# Patient Record
Sex: Female | Born: 1965 | Race: Black or African American | Hispanic: No | Marital: Married | Smoking: Never smoker
Health system: Southern US, Community
[De-identification: ages and names within clinical notes are randomized; demographics above are authoritative.]

## PROBLEM LIST (undated history)

## (undated) DIAGNOSIS — I493 Ventricular premature depolarization: Secondary | ICD-10-CM

## (undated) DIAGNOSIS — E039 Hypothyroidism, unspecified: Secondary | ICD-10-CM

## (undated) DIAGNOSIS — D649 Anemia, unspecified: Secondary | ICD-10-CM

## (undated) DIAGNOSIS — G43909 Migraine, unspecified, not intractable, without status migrainosus: Secondary | ICD-10-CM

## (undated) DIAGNOSIS — I491 Atrial premature depolarization: Secondary | ICD-10-CM

## (undated) HISTORY — DX: Ventricular premature depolarization: I49.3

## (undated) HISTORY — DX: Hypothyroidism, unspecified: E03.9

## (undated) HISTORY — DX: Atrial premature depolarization: I49.1

## (undated) HISTORY — DX: Migraine, unspecified, not intractable, without status migrainosus: G43.909

## (undated) HISTORY — DX: Anemia, unspecified: D64.9

---

## 1993-05-20 HISTORY — PX: TUBAL LIGATION: SHX77

## 1998-02-17 ENCOUNTER — Encounter: Payer: Self-pay | Admitting: Internal Medicine

## 1998-02-17 ENCOUNTER — Encounter: Admission: RE | Admit: 1998-02-17 | Discharge: 1998-05-18 | Payer: Self-pay | Admitting: Internal Medicine

## 1999-03-05 ENCOUNTER — Encounter: Admission: RE | Admit: 1999-03-05 | Discharge: 1999-03-05 | Payer: Self-pay | Admitting: Family Medicine

## 1999-04-30 ENCOUNTER — Ambulatory Visit (HOSPITAL_COMMUNITY): Admission: RE | Admit: 1999-04-30 | Discharge: 1999-04-30 | Payer: Self-pay | Admitting: Internal Medicine

## 1999-05-03 ENCOUNTER — Encounter: Payer: Self-pay | Admitting: Internal Medicine

## 1999-05-03 ENCOUNTER — Ambulatory Visit (HOSPITAL_COMMUNITY): Admission: RE | Admit: 1999-05-03 | Discharge: 1999-05-03 | Payer: Self-pay | Admitting: Internal Medicine

## 1999-07-10 ENCOUNTER — Other Ambulatory Visit: Admission: RE | Admit: 1999-07-10 | Discharge: 1999-07-10 | Payer: Self-pay | Admitting: Family Medicine

## 1999-09-07 ENCOUNTER — Encounter: Admission: RE | Admit: 1999-09-07 | Discharge: 1999-09-07 | Payer: Self-pay | Admitting: Family Medicine

## 1999-09-07 ENCOUNTER — Encounter: Payer: Self-pay | Admitting: Family Medicine

## 2000-07-07 ENCOUNTER — Other Ambulatory Visit: Admission: RE | Admit: 2000-07-07 | Discharge: 2000-07-07 | Payer: Self-pay | Admitting: Family Medicine

## 2002-04-14 ENCOUNTER — Ambulatory Visit (HOSPITAL_COMMUNITY): Admission: RE | Admit: 2002-04-14 | Discharge: 2002-04-14 | Payer: Self-pay | Admitting: Family Medicine

## 2002-04-14 ENCOUNTER — Encounter: Payer: Self-pay | Admitting: Family Medicine

## 2002-10-08 ENCOUNTER — Other Ambulatory Visit: Admission: RE | Admit: 2002-10-08 | Discharge: 2002-10-08 | Payer: Self-pay | Admitting: Family Medicine

## 2002-10-12 ENCOUNTER — Encounter: Admission: RE | Admit: 2002-10-12 | Discharge: 2002-10-12 | Payer: Self-pay | Admitting: Family Medicine

## 2002-10-12 ENCOUNTER — Encounter: Payer: Self-pay | Admitting: Family Medicine

## 2002-11-09 ENCOUNTER — Ambulatory Visit (HOSPITAL_COMMUNITY): Admission: RE | Admit: 2002-11-09 | Discharge: 2002-11-09 | Payer: Self-pay | Admitting: Internal Medicine

## 2004-01-10 ENCOUNTER — Encounter: Admission: RE | Admit: 2004-01-10 | Discharge: 2004-01-10 | Payer: Self-pay | Admitting: Family Medicine

## 2005-01-04 ENCOUNTER — Other Ambulatory Visit: Admission: RE | Admit: 2005-01-04 | Discharge: 2005-01-04 | Payer: Self-pay | Admitting: Family Medicine

## 2005-04-23 ENCOUNTER — Encounter: Admission: RE | Admit: 2005-04-23 | Discharge: 2005-04-23 | Payer: Self-pay | Admitting: Family Medicine

## 2005-07-02 ENCOUNTER — Encounter: Admission: RE | Admit: 2005-07-02 | Discharge: 2005-07-02 | Payer: Self-pay | Admitting: Family Medicine

## 2006-05-14 ENCOUNTER — Encounter: Admission: RE | Admit: 2006-05-14 | Discharge: 2006-05-14 | Payer: Self-pay | Admitting: Family Medicine

## 2007-07-02 ENCOUNTER — Encounter: Admission: RE | Admit: 2007-07-02 | Discharge: 2007-07-02 | Payer: Self-pay | Admitting: Family Medicine

## 2008-01-22 ENCOUNTER — Encounter: Admission: RE | Admit: 2008-01-22 | Discharge: 2008-01-22 | Payer: Self-pay | Admitting: Family Medicine

## 2008-01-28 ENCOUNTER — Encounter: Admission: RE | Admit: 2008-01-28 | Discharge: 2008-01-28 | Payer: Self-pay | Admitting: Family Medicine

## 2009-03-06 ENCOUNTER — Encounter: Admission: RE | Admit: 2009-03-06 | Discharge: 2009-03-06 | Payer: Self-pay | Admitting: Family Medicine

## 2009-06-08 ENCOUNTER — Emergency Department (HOSPITAL_COMMUNITY): Admission: EM | Admit: 2009-06-08 | Discharge: 2009-06-08 | Payer: Self-pay | Admitting: Family Medicine

## 2010-01-25 ENCOUNTER — Encounter: Admission: RE | Admit: 2010-01-25 | Discharge: 2010-01-25 | Payer: Self-pay | Admitting: Family Medicine

## 2010-01-29 ENCOUNTER — Ambulatory Visit: Payer: Self-pay | Admitting: Cardiology

## 2010-02-01 ENCOUNTER — Ambulatory Visit: Payer: Self-pay | Admitting: Cardiology

## 2010-02-01 ENCOUNTER — Ambulatory Visit (HOSPITAL_COMMUNITY): Admission: RE | Admit: 2010-02-01 | Discharge: 2010-02-01 | Payer: Self-pay | Admitting: Cardiology

## 2010-08-06 LAB — POCT I-STAT, CHEM 8
Calcium, Ion: 1.21 mmol/L (ref 1.12–1.32)
Potassium: 4 mEq/L (ref 3.5–5.1)
Sodium: 141 mEq/L (ref 135–145)
TCO2: 27 mmol/L (ref 0–100)

## 2010-08-06 LAB — TSH: TSH: 0.065 u[IU]/mL — ABNORMAL LOW (ref 0.350–4.500)

## 2010-10-05 NOTE — Op Note (Signed)
NAME:  GALE, KLAR NO.:  [id number]   MEDICAL RECORD NO.:  [id number]                   PATIENT TYPE:  AMB   LOCATION:  ENDO                                 FACILITY:  [facility]   PHYSICIAN:  Lina Sar, M.D. [facility]               DATE OF BIRTH:  Aug 17, 1965   DATE OF PROCEDURE:  11/09/2002  DATE OF DISCHARGE:                                 OPERATIVE REPORT   NAME OF PROCEDURE:  Colonoscopy.   INDICATIONS FOR PROCEDURE:  This 45 year old African-American female has a  history of chronic constipation as well as symptoms of epigastric pain.  She  has tried over-the-counter medications and has tried Zelnorm 6 mg b.i.d.,  but because of expense of the medication, she has not been able to take it.  She was put on MiraLax 17 g a day on Oct 11, 2002, with some improvement of  her symptoms.  She has responded very well to her colonoscopy prep, and is  scheduled for screening colonoscopy today to evaluate for her constipation,  to rule out structure changes of her colon.   ENDOSCOPE:  Olympus single chamber video endoscope.   SEDATION:  1. Versed 5 mg IV.  2. Demerol 100 mg IV.   FINDINGS:  The Olympus single channel videoendoscope was passed in the  rectum to the sigmoid colon.  The patient was monitored by pulse oximeter  and oxygen saturations were normal.  Her prep was excellent.  There was no  residual stool in the entire colon.  Anal canal showed small internal  hemorrhoids.  These did not appear to be bleeding.  Sigmoid colon was  normal.  The entire colonic mucosa appeared unremarkable, although there was  some difficulty in negotiating some of the turns through the colon.  There  was no evidence of redundancy.  There were no diverticula.  Colonoscope  passed through the stomach, transverse, into hepatic flexure and ascending  colon.  Cecal pouch was re-checked, the patient was turned in the left  lateral decubitus position.  Video photographs of  the cecal pouch were  taken.  There was normal ileocecal vale and appendiceal opening.  Colonoscope was then slowly retracted from the right to the left colon to  observe the mucosa which appeared to be normal.  There were no polyps.  The  patient tolerated the procedure well.   IMPRESSION:  Normal colonoscopy to the cecum.    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  She is to continue her MiraLax 17 g daily.  I would also  advise that she continue her Zelnorm 6 mg daily to improve the bowel habits.  Lina Sar, M.D. [facility]    DB/MEDQ  D:  11/09/2002  T:  11/09/2002  Job:  [id number]   cc:   Talmadge Coventry, M.D.  [address]  [place]  [place] [address]  Fax: [phone]

## 2010-11-09 ENCOUNTER — Other Ambulatory Visit: Payer: Self-pay | Admitting: Family Medicine

## 2010-11-09 DIAGNOSIS — Z1231 Encounter for screening mammogram for malignant neoplasm of breast: Secondary | ICD-10-CM

## 2010-11-29 ENCOUNTER — Ambulatory Visit
Admission: RE | Admit: 2010-11-29 | Discharge: 2010-11-29 | Disposition: A | Discharge status: Home / Self Care | Payer: 59 | Source: Ambulatory Visit | Attending: Family Medicine | Admitting: Family Medicine

## 2010-11-29 DIAGNOSIS — Z1231 Encounter for screening mammogram for malignant neoplasm of breast: Secondary | ICD-10-CM

## 2011-09-27 ENCOUNTER — Encounter: Payer: Self-pay | Admitting: *Deleted

## 2012-01-29 ENCOUNTER — Other Ambulatory Visit: Payer: Self-pay | Admitting: Family Medicine

## 2012-01-29 DIAGNOSIS — Z1231 Encounter for screening mammogram for malignant neoplasm of breast: Secondary | ICD-10-CM

## 2012-02-05 ENCOUNTER — Encounter: Payer: Self-pay | Admitting: Cardiology

## 2012-02-06 ENCOUNTER — Ambulatory Visit
Admission: RE | Admit: 2012-02-06 | Discharge: 2012-02-06 | Disposition: A | Discharge status: Home / Self Care | Payer: 59 | Source: Ambulatory Visit | Attending: Family Medicine | Admitting: Family Medicine

## 2012-02-06 DIAGNOSIS — Z1231 Encounter for screening mammogram for malignant neoplasm of breast: Secondary | ICD-10-CM

## 2012-02-07 ENCOUNTER — Other Ambulatory Visit: Payer: Self-pay | Admitting: Family Medicine

## 2012-02-07 DIAGNOSIS — R928 Other abnormal and inconclusive findings on diagnostic imaging of breast: Secondary | ICD-10-CM

## 2012-02-12 ENCOUNTER — Ambulatory Visit
Admission: RE | Admit: 2012-02-12 | Discharge: 2012-02-12 | Disposition: A | Discharge status: Home / Self Care | Payer: 59 | Source: Ambulatory Visit | Attending: Family Medicine | Admitting: Family Medicine

## 2012-02-12 DIAGNOSIS — R928 Other abnormal and inconclusive findings on diagnostic imaging of breast: Secondary | ICD-10-CM

## 2013-03-04 ENCOUNTER — Other Ambulatory Visit: Payer: Self-pay

## 2013-03-04 DIAGNOSIS — Z1231 Encounter for screening mammogram for malignant neoplasm of breast: Secondary | ICD-10-CM

## 2013-03-29 ENCOUNTER — Ambulatory Visit
Admission: RE | Admit: 2013-03-29 | Discharge: 2013-03-29 | Disposition: A | Discharge status: Home / Self Care | Payer: 59 | Source: Ambulatory Visit

## 2013-03-29 DIAGNOSIS — Z1231 Encounter for screening mammogram for malignant neoplasm of breast: Secondary | ICD-10-CM

## 2014-04-04 ENCOUNTER — Other Ambulatory Visit: Payer: Self-pay

## 2014-04-04 DIAGNOSIS — Z1231 Encounter for screening mammogram for malignant neoplasm of breast: Secondary | ICD-10-CM

## 2014-04-19 ENCOUNTER — Ambulatory Visit
Admission: RE | Admit: 2014-04-19 | Discharge: 2014-04-19 | Disposition: A | Discharge status: Home / Self Care | Payer: 59 | Source: Ambulatory Visit

## 2014-04-19 DIAGNOSIS — Z1231 Encounter for screening mammogram for malignant neoplasm of breast: Secondary | ICD-10-CM

## 2014-05-06 ENCOUNTER — Other Ambulatory Visit (HOSPITAL_COMMUNITY)
Admission: RE | Admit: 2014-05-06 | Discharge: 2014-05-06 | Disposition: A | Discharge status: Home / Self Care | Payer: 59 | Source: Ambulatory Visit | Attending: Family Medicine | Admitting: Family Medicine

## 2014-05-06 ENCOUNTER — Other Ambulatory Visit: Payer: Self-pay | Admitting: Family Medicine

## 2014-05-06 DIAGNOSIS — Z01419 Encounter for gynecological examination (general) (routine) without abnormal findings: Secondary | ICD-10-CM | POA: Insufficient documentation

## 2014-05-10 ENCOUNTER — Encounter: Payer: Self-pay | Admitting: Internal Medicine

## 2014-05-11 LAB — CYTOLOGY - PAP

## 2014-06-22 ENCOUNTER — Encounter: Payer: Self-pay | Admitting: Internal Medicine

## 2014-07-15 ENCOUNTER — Ambulatory Visit (AMBULATORY_SURGERY_CENTER): Payer: Self-pay | Admitting: *Deleted

## 2014-07-15 VITALS — Ht 66.0 in | Wt 159.8 lb

## 2014-07-15 DIAGNOSIS — Z1211 Encounter for screening for malignant neoplasm of colon: Secondary | ICD-10-CM

## 2014-07-15 MED ORDER — MOVIPREP 100 G PO SOLR: ORAL | Status: DC

## 2014-07-15 NOTE — Progress Notes (Signed)
No allergies to eggs or soy. No problems with anesthesia.  Pt given Emmi instructions for colonoscopy  No oxygen use  No diet drug use  

## 2014-07-29 ENCOUNTER — Encounter: Payer: Self-pay | Admitting: Internal Medicine

## 2014-07-29 ENCOUNTER — Ambulatory Visit (AMBULATORY_SURGERY_CENTER): Payer: 59 | Admitting: Internal Medicine

## 2014-07-29 VITALS — BP 111/79 | HR 65 | Temp 96.9°F | Resp 23 | Ht 66.0 in | Wt 159.0 lb

## 2014-07-29 DIAGNOSIS — Z1211 Encounter for screening for malignant neoplasm of colon: Secondary | ICD-10-CM

## 2014-07-29 MED ORDER — SODIUM CHLORIDE 0.9 % IV SOLN: 500.0000 mL | INTRAVENOUS | Status: DC

## 2014-07-29 NOTE — Patient Instructions (Signed)
YOU HAD AN ENDOSCOPIC PROCEDURE TODAY AT THE [facility]:   Refer to the procedure report that was given to you for any specific questions about what was found during the examination.  If the procedure report does not answer your questions, please call your gastroenterologist to clarify.  If you requested that your care partner not be given the details of your procedure findings, then the procedure report has been included in a sealed envelope for you to review at your convenience later.  YOU SHOULD EXPECT: Some feelings of bloating in the abdomen. Passage of more gas than usual.  Walking can help get rid of the air that was put into your GI tract during the procedure and reduce the bloating. If you had a lower endoscopy (such as a colonoscopy or flexible sigmoidoscopy) you may notice spotting of blood in your stool or on the toilet paper. If you underwent a bowel prep for your procedure, you may not have a normal bowel movement for a few days.  Please Note:  You might notice some irritation and congestion in your nose or some drainage.  This is from the oxygen used during your procedure.  There is no need for concern and it should clear up in a day or so.  SYMPTOMS TO REPORT IMMEDIATELY:   Following lower endoscopy (colonoscopy or flexible sigmoidoscopy):  Excessive amounts of blood in the stool  Significant tenderness or worsening of abdominal pains  Swelling of the abdomen that is new, acute  Fever of 100F or higher   For urgent or emergent issues, a gastroenterologist can be reached at any hour by calling [phone].   DIET: Your first meal following the procedure should be a small meal and then it is ok to progress to your normal diet. Heavy or fried foods are harder to digest and may make you feel nauseous or bloated.  Likewise, meals heavy in dairy and vegetables can increase bloating.  Drink plenty of fluids but you should avoid alcoholic beverages for 24  hours.  ACTIVITY:  You should plan to take it easy for the rest of today and you should NOT DRIVE or use heavy machinery until tomorrow (because of the sedation medicines used during the test).    FOLLOW UP: Our staff will call the number listed on your records the next business day following your procedure to check on you and address any questions or concerns that you may have regarding the information given to you following your procedure. If we do not reach you, we will leave a message.  However, if you are feeling well and you are not experiencing any problems, there is no need to return our call.  We will assume that you have returned to your regular daily activities without incident.  If any biopsies were taken you will be contacted by phone or by letter within the next 1-3 weeks.  Please call us at [phone] if you have not heard about the biopsies in 3 weeks.    SIGNATURES/CONFIDENTIALITY: You and/or your care partner have signed paperwork which will be entered into your electronic medical record.  These signatures attest to the fact that that the information above on your After Visit Summary has been reviewed and is understood.  Full responsibility of the confidentiality of this discharge information lies with you and/or your care-partner.  Read the handouts given to you by your recovery room nurse. 

## 2014-07-29 NOTE — Progress Notes (Signed)
May discharge in 20 minutes if stable per Dr. Juanda ChanceBrodie.

## 2014-07-29 NOTE — Op Note (Signed)
[facility] [address] [place], [id number]   COLONOSCOPY PROCEDURE REPORT  PATIENT: Frances Mckenzie, Frances Mckenzie  MR#: [id number] BIRTHDATE: 1966/04/26 , 48  yrs. old GENDER: female ENDOSCOPIST: Hart Carwinora M Brodie, MD REFERRED XB:JYNWGNBY:Vyvyan Wynelle LinkSun, M.D. PROCEDURE DATE:  07/29/2014 PROCEDURE:   Colonoscopy, screening First Screening Colonoscopy - Avg.  risk and is 50 yrs.  old or older - No.  Prior Negative Screening - Now for repeat screening. 10 or more years since last screening  History of Adenoma - Now for follow-up colonoscopy & has been > or = to 3 yrs.  N/A ASA CLASS:   Class I INDICATIONS:Screening for colonic neoplasia, Colorectal Neoplasm Risk Assessment for this procedure is average risk, and Prior colonoscopy in June 2004 was normal. MEDICATIONS: Monitored anesthesia care and Propofol 350 mg IV  DESCRIPTION OF PROCEDURE:   After the risks benefits and alternatives of the procedure were thoroughly explained, informed consent was obtained.  The digital rectal exam revealed no abnormalities of the rectum.   The LB [id number]-H190 [id number]  endoscope was introduced through the anus and advanced to the cecum, which was identified by the ileocecal valve. No adverse events experienced.   The quality of the prep was good.  (MoviPrep was used)  The instrument was then slowly withdrawn as the colon was fully examined.      COLON FINDINGS: A normal appearing cecum, ileocecal valve, and appendiceal orifice were identified.  The ascending, transverse, descending, sigmoid colon, and rectum appeared unremarkable.   The colon was redundant.  Manual abdominal counter-pressure was used to reach the cecum.  The patient was moved on to their right side to reach the cecum.  Retroflexed views revealed no abnormalities. The time to cecum = 17.00 Withdrawal time = 6.03   The scope was withdrawn and the procedure completed. COMPLICATIONS: There were no immediate complications.  ENDOSCOPIC  IMPRESSION: 1.   Normal colonoscopy 2.   The colon was redundant  RECOMMENDATIONS: High fiber diet Recall colonoscopy in 10 years  eSigned:  Hart Carwinora M Brodie, MD 07/29/2014 10:29 AM   cc:

## 2014-07-29 NOTE — Progress Notes (Signed)
To rcovery, report given to Yetta FlockHodges, Charity fundraiserN. VSS

## 2014-07-29 NOTE — Progress Notes (Addendum)
Patient entered the RR sleeping.  When she woke up a little, she started moaning and holding her ab.  I tried to palpate but she shoved my hand out of the way.  She was given levsin at this time.   Didn't seem to help so a rectal tube was inserted.   A lot of air was released with a merky fluid. Patient still complains of pain. Turned from side to side. Will have patient get dressed and walk the hall.  Patient's abd is soft.  Dr. Juanda ChanceBrodie is aware.  Dr. Juanda ChanceBrodie stated that she could go home since she had a redundant colon. Drink warm fluids etc. Patient aware. Patient laughing and joking upon discharge.

## 2014-08-01 ENCOUNTER — Telehealth: Payer: Self-pay | Admitting: *Deleted

## 2014-08-01 NOTE — Telephone Encounter (Signed)
Message left on f/u call °

## 2014-12-21 ENCOUNTER — Other Ambulatory Visit: Payer: Self-pay | Admitting: Family Medicine

## 2014-12-21 DIAGNOSIS — M25562 Pain in left knee: Secondary | ICD-10-CM

## 2014-12-25 ENCOUNTER — Ambulatory Visit
Admission: RE | Admit: 2014-12-25 | Discharge: 2014-12-25 | Disposition: A | Discharge status: Home / Self Care | Payer: 59 | Source: Ambulatory Visit | Attending: Family Medicine | Admitting: Family Medicine

## 2014-12-25 DIAGNOSIS — M25562 Pain in left knee: Secondary | ICD-10-CM

## 2015-08-03 ENCOUNTER — Other Ambulatory Visit: Payer: Self-pay

## 2015-08-03 DIAGNOSIS — Z1231 Encounter for screening mammogram for malignant neoplasm of breast: Secondary | ICD-10-CM

## 2015-08-15 ENCOUNTER — Ambulatory Visit
Admission: RE | Admit: 2015-08-15 | Discharge: 2015-08-15 | Disposition: A | Discharge status: Home / Self Care | Payer: Commercial Managed Care - HMO | Source: Ambulatory Visit

## 2015-08-15 DIAGNOSIS — Z1231 Encounter for screening mammogram for malignant neoplasm of breast: Secondary | ICD-10-CM

## 2015-11-28 ENCOUNTER — Other Ambulatory Visit: Payer: Self-pay | Admitting: Family Medicine

## 2015-11-28 ENCOUNTER — Other Ambulatory Visit (HOSPITAL_COMMUNITY)
Admission: RE | Admit: 2015-11-28 | Discharge: 2015-11-28 | Disposition: A | Discharge status: Home / Self Care | Payer: Commercial Managed Care - HMO | Source: Ambulatory Visit | Attending: Family Medicine | Admitting: Family Medicine

## 2015-11-28 DIAGNOSIS — Z01419 Encounter for gynecological examination (general) (routine) without abnormal findings: Secondary | ICD-10-CM | POA: Diagnosis not present

## 2015-11-29 LAB — CYTOLOGY - PAP

## 2016-10-15 DIAGNOSIS — R21 Rash and other nonspecific skin eruption: Secondary | ICD-10-CM | POA: Diagnosis not present

## 2017-06-24 DIAGNOSIS — E039 Hypothyroidism, unspecified: Secondary | ICD-10-CM | POA: Diagnosis not present

## 2017-06-24 DIAGNOSIS — D509 Iron deficiency anemia, unspecified: Secondary | ICD-10-CM | POA: Diagnosis not present

## 2017-06-24 DIAGNOSIS — E785 Hyperlipidemia, unspecified: Secondary | ICD-10-CM | POA: Diagnosis not present

## 2017-06-24 DIAGNOSIS — E559 Vitamin D deficiency, unspecified: Secondary | ICD-10-CM | POA: Diagnosis not present

## 2017-08-23 LAB — GLUCOSE, POCT (MANUAL RESULT ENTRY): POC GLUCOSE: 79 mg/dL (ref 70–99)

## 2017-12-05 DIAGNOSIS — E785 Hyperlipidemia, unspecified: Secondary | ICD-10-CM | POA: Diagnosis not present

## 2017-12-05 DIAGNOSIS — E559 Vitamin D deficiency, unspecified: Secondary | ICD-10-CM | POA: Diagnosis not present

## 2017-12-05 DIAGNOSIS — Z0000 Encounter for general adult medical examination without abnormal findings: Secondary | ICD-10-CM | POA: Diagnosis not present

## 2017-12-05 DIAGNOSIS — E039 Hypothyroidism, unspecified: Secondary | ICD-10-CM | POA: Diagnosis not present

## 2018-01-28 DIAGNOSIS — E039 Hypothyroidism, unspecified: Secondary | ICD-10-CM | POA: Diagnosis not present

## 2018-02-18 ENCOUNTER — Other Ambulatory Visit: Payer: Self-pay | Admitting: Family Medicine

## 2018-02-18 DIAGNOSIS — Z1231 Encounter for screening mammogram for malignant neoplasm of breast: Secondary | ICD-10-CM

## 2018-03-30 ENCOUNTER — Ambulatory Visit
Admission: RE | Admit: 2018-03-30 | Discharge: 2018-03-30 | Disposition: A | Discharge status: Home / Self Care | Payer: 59 | Source: Ambulatory Visit | Attending: Family Medicine | Admitting: Family Medicine

## 2018-03-30 DIAGNOSIS — Z1231 Encounter for screening mammogram for malignant neoplasm of breast: Secondary | ICD-10-CM

## 2018-10-22 ENCOUNTER — Other Ambulatory Visit: Payer: Self-pay | Admitting: Family Medicine

## 2018-10-22 DIAGNOSIS — E039 Hypothyroidism, unspecified: Secondary | ICD-10-CM

## 2018-10-22 DIAGNOSIS — R221 Localized swelling, mass and lump, neck: Secondary | ICD-10-CM

## 2018-11-02 ENCOUNTER — Other Ambulatory Visit: Payer: 59

## 2018-12-07 ENCOUNTER — Other Ambulatory Visit: Payer: Self-pay | Admitting: Family Medicine

## 2018-12-07 ENCOUNTER — Other Ambulatory Visit (HOSPITAL_COMMUNITY)
Admission: RE | Admit: 2018-12-07 | Discharge: 2018-12-07 | Disposition: A | Discharge status: Home / Self Care | Payer: 59 | Source: Ambulatory Visit | Attending: Family Medicine | Admitting: Family Medicine

## 2018-12-07 DIAGNOSIS — Z0000 Encounter for general adult medical examination without abnormal findings: Secondary | ICD-10-CM | POA: Diagnosis not present

## 2018-12-08 ENCOUNTER — Other Ambulatory Visit: Payer: Self-pay | Admitting: Family Medicine

## 2018-12-08 DIAGNOSIS — N852 Hypertrophy of uterus: Secondary | ICD-10-CM

## 2018-12-08 LAB — CYTOLOGY - PAP: Diagnosis: NEGATIVE

## 2018-12-23 ENCOUNTER — Ambulatory Visit
Admission: RE | Admit: 2018-12-23 | Discharge: 2018-12-23 | Disposition: A | Discharge status: Home / Self Care | Payer: 59 | Source: Ambulatory Visit | Attending: Family Medicine | Admitting: Family Medicine

## 2018-12-23 DIAGNOSIS — N852 Hypertrophy of uterus: Secondary | ICD-10-CM

## 2019-03-30 ENCOUNTER — Other Ambulatory Visit: Payer: Self-pay | Admitting: Family Medicine

## 2019-03-30 DIAGNOSIS — Z1231 Encounter for screening mammogram for malignant neoplasm of breast: Secondary | ICD-10-CM

## 2019-05-25 ENCOUNTER — Other Ambulatory Visit: Payer: Self-pay

## 2019-05-25 ENCOUNTER — Ambulatory Visit
Admission: RE | Admit: 2019-05-25 | Discharge: 2019-05-25 | Disposition: A | Discharge status: Home / Self Care | Payer: 59 | Source: Ambulatory Visit | Attending: Family Medicine | Admitting: Family Medicine

## 2019-05-25 DIAGNOSIS — Z1231 Encounter for screening mammogram for malignant neoplasm of breast: Secondary | ICD-10-CM

## 2019-05-26 ENCOUNTER — Other Ambulatory Visit: Payer: Self-pay | Admitting: Family Medicine

## 2019-05-26 DIAGNOSIS — R928 Other abnormal and inconclusive findings on diagnostic imaging of breast: Secondary | ICD-10-CM

## 2019-06-17 ENCOUNTER — Other Ambulatory Visit: Payer: Self-pay

## 2019-06-17 ENCOUNTER — Ambulatory Visit
Admission: RE | Admit: 2019-06-17 | Discharge: 2019-06-17 | Disposition: A | Discharge status: Home / Self Care | Payer: 59 | Source: Ambulatory Visit | Attending: Family Medicine | Admitting: Family Medicine

## 2019-06-17 DIAGNOSIS — R928 Other abnormal and inconclusive findings on diagnostic imaging of breast: Secondary | ICD-10-CM

## 2020-04-10 IMAGING — US US PELVIS COMPLETE WITH TRANSVAGINAL
1 series · 13 of 25 positions shown · non-contrast
Comparison: 01/28/2008

CLINICAL DATA: Enlarged uterus, postmenopausal



[Series 1: us pelvis complete with transvaginal · 0.26mm/px · 13 of 43 slices shown]
[im 1/43]
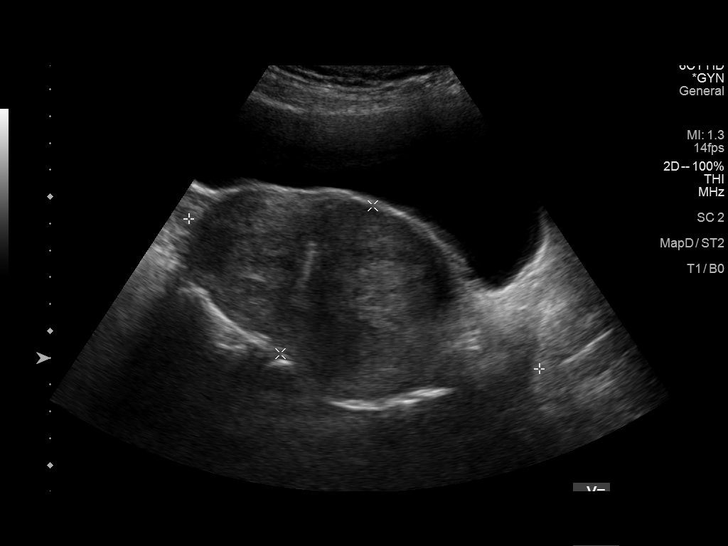
[im 4/43]
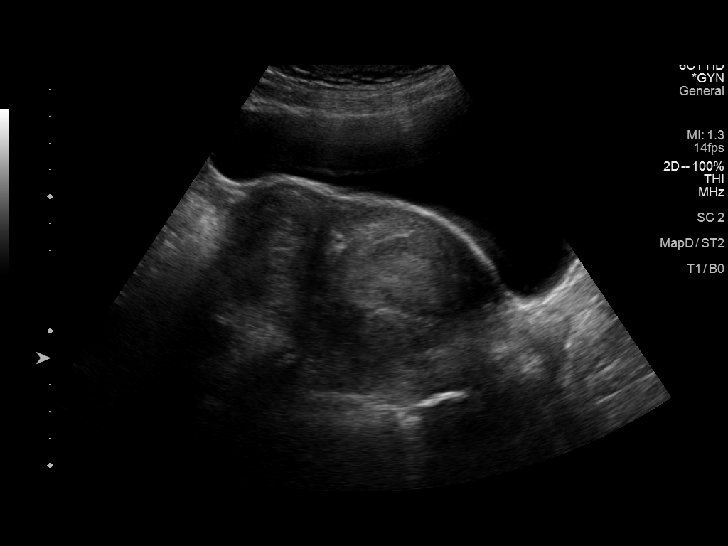
[im 8/43]
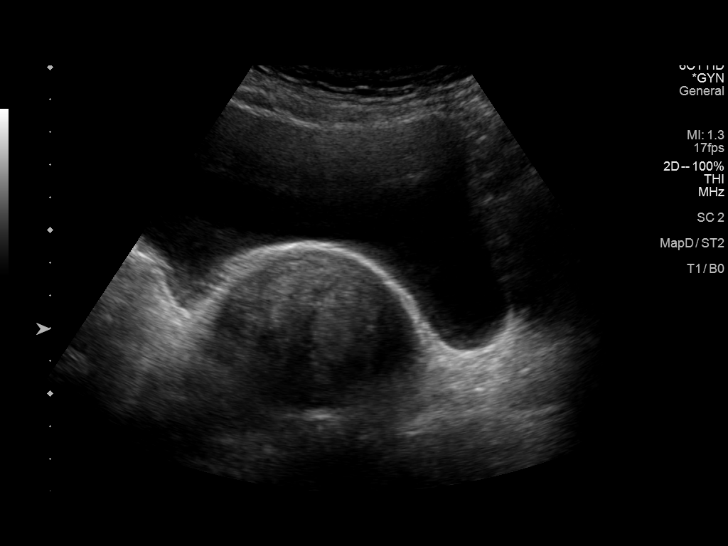
[im 11/43]
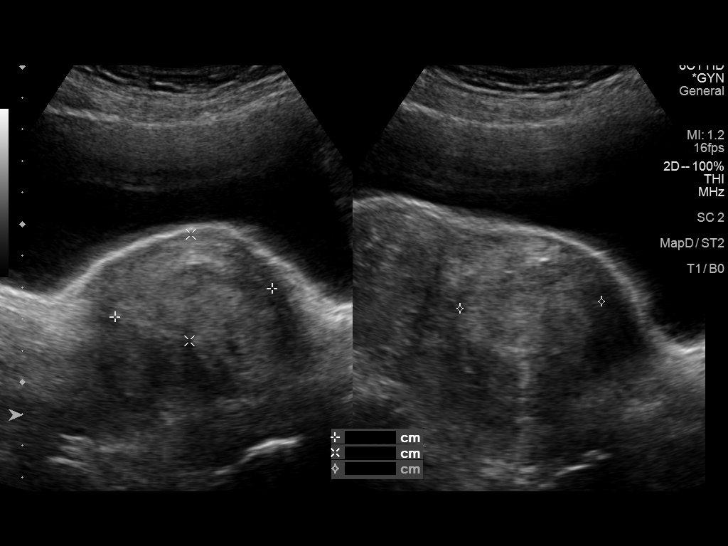
[im 15/43]
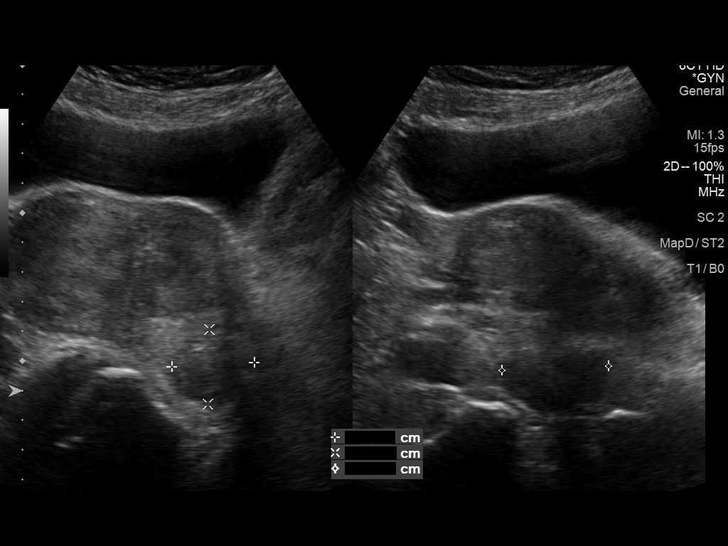
[im 18/43]
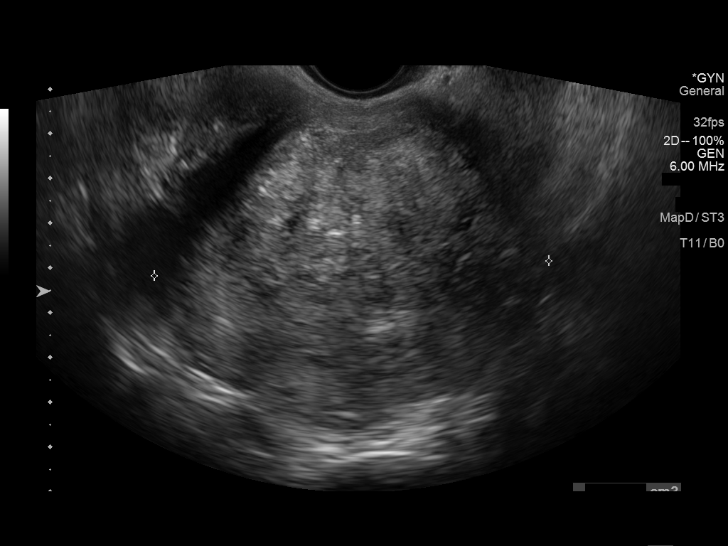
[im 22/43]
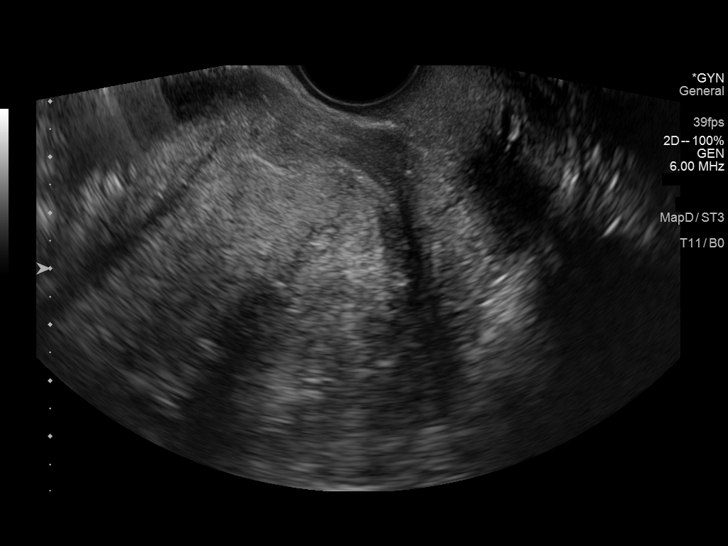
[im 25/43]
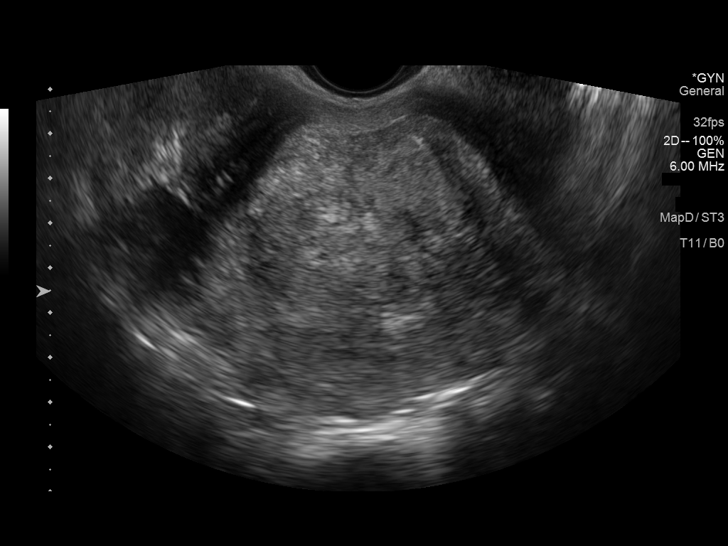
[im 29/43]
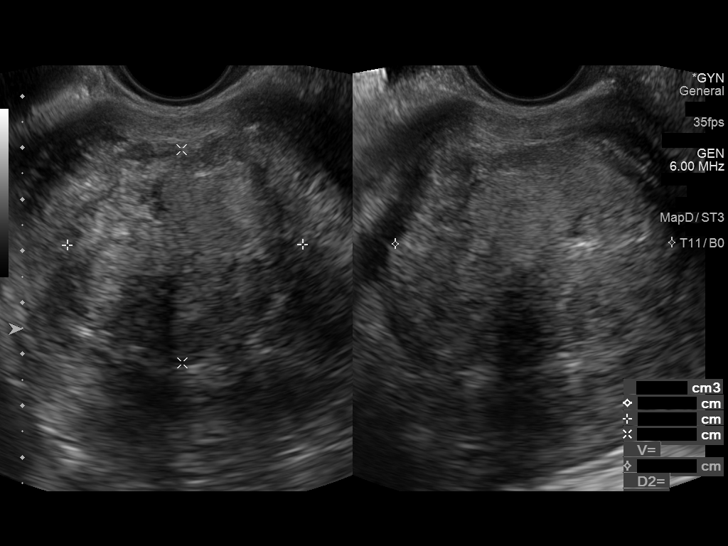
[im 32/43]
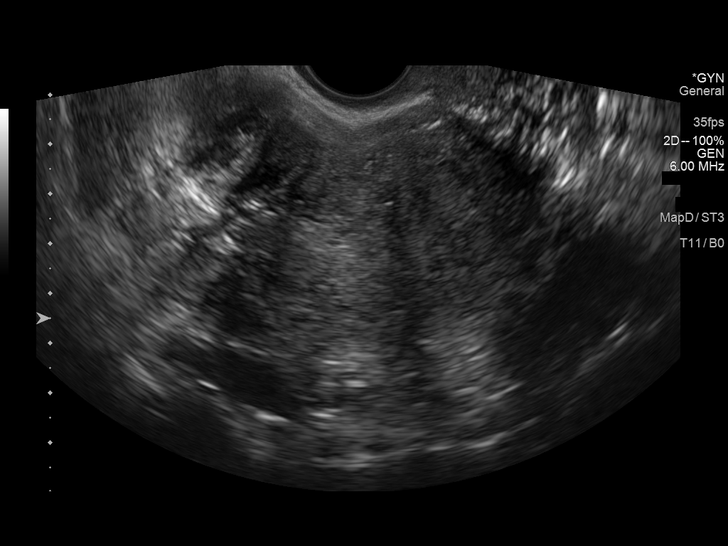
[im 36/43]
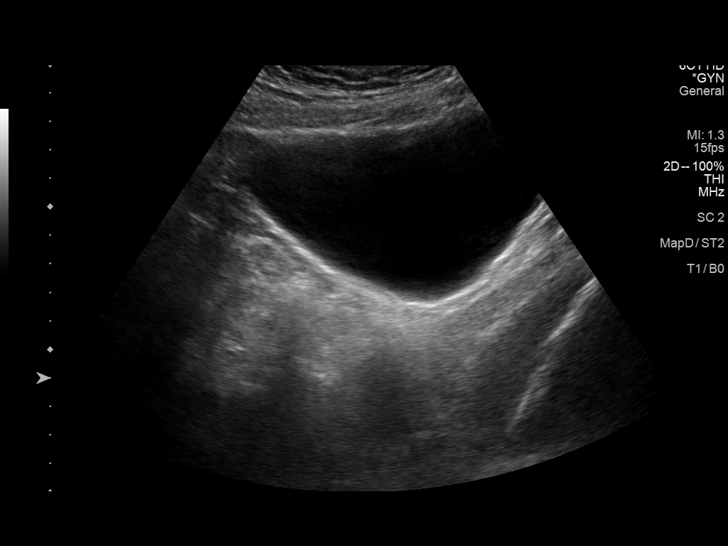
[im 39/43]
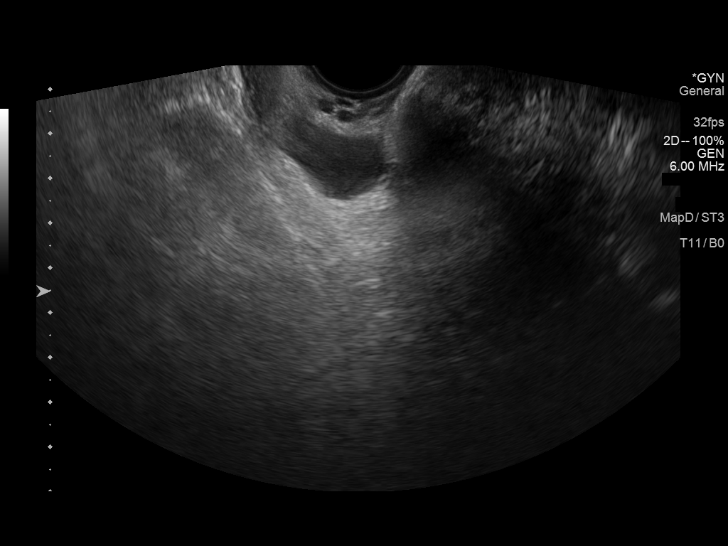
[im 43/43]
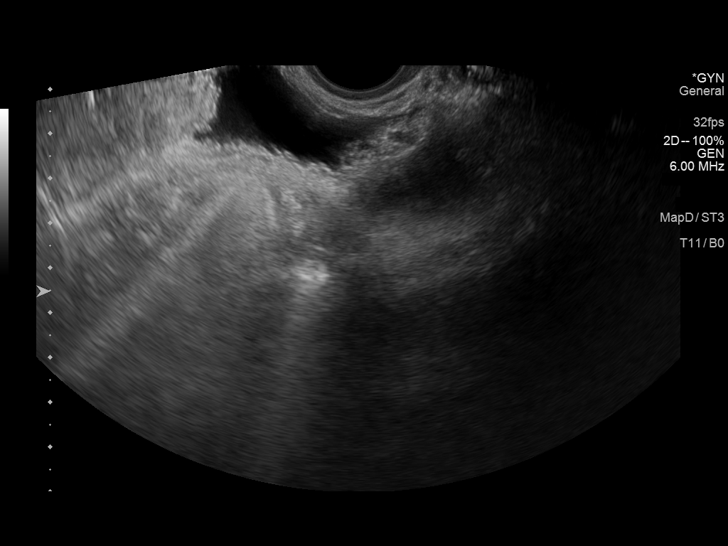

[13 of 25 positions shown; findings below may reference images not displayed]

FINDINGS: Uterus

Measurements: 14.2 x 6.5 x 7.7 cm = volume: 372 mL. Enlarged uterus
with heterogeneous myometrium. Small RIGHT fundal subserosal uterine
leiomyoma 4.3 x 3.7 x 4.2 cm. Small posterior LEFT leiomyoma 2.8 x
2.5 x 3.6 cm. Large central mass within the uterus, 3.6 x 4.6 x
cm, likely an additional submucosal leiomyoma.

Endometrium

Thickness: Not adequately delineated due to distortion related to
multiple leiomyomata

Right ovary

Not visualized on either transabdominal or endovaginal imaging,
likely obscured by bowel

Left ovary

Not visualized on either transabdominal or endovaginal imaging,
likely obscured by bowel

Other findings

No free pelvic fluid.  No adnexal masses.
IMPRESSION: Nonvisualization of ovaries and endometrial complex.

Enlarged uterus containing multiple probable leiomyomata as above.

## 2020-05-02 ENCOUNTER — Ambulatory Visit: Payer: 59 | Attending: Internal Medicine

## 2020-05-02 DIAGNOSIS — Z23 Encounter for immunization: Secondary | ICD-10-CM

## 2020-05-02 NOTE — Progress Notes (Signed)
   Covid-19 Vaccination Clinic  Name:  Frances Mckenzie    MRN: [id number] DOB: 1965-11-17  05/02/2020  Frances Mckenzie was observed post Covid-19 immunization for 15 minutes without incident. She was provided with Vaccine Information Sheet and instruction to access the V-Safe system.   Frances Mckenzie was instructed to call 911 with any severe reactions post vaccine: Marland Kitchen Difficulty breathing  . Swelling of face and throat  . A fast heartbeat  . A bad rash all over body  . Dizziness and weakness   Immunizations Administered    Name Date Dose VIS Date Route   Pfizer COVID-19 Vaccine 05/02/2020  4:51 PM 0.3 mL 03/08/2020 Intramuscular   Manufacturer: [employer], [employer]   Lot: [id number]   NDC: [id number]

## 2020-06-23 ENCOUNTER — Other Ambulatory Visit: Payer: Self-pay | Admitting: Family Medicine

## 2020-06-23 DIAGNOSIS — Z1231 Encounter for screening mammogram for malignant neoplasm of breast: Secondary | ICD-10-CM

## 2020-08-09 ENCOUNTER — Ambulatory Visit: Payer: 59

## 2020-08-12 ENCOUNTER — Ambulatory Visit
Admission: RE | Admit: 2020-08-12 | Discharge: 2020-08-12 | Disposition: A | Discharge status: Home / Self Care | Payer: 59 | Source: Ambulatory Visit | Attending: Family Medicine | Admitting: Family Medicine

## 2020-08-12 ENCOUNTER — Other Ambulatory Visit: Payer: Self-pay

## 2020-08-12 DIAGNOSIS — Z1231 Encounter for screening mammogram for malignant neoplasm of breast: Secondary | ICD-10-CM

## 2021-01-25 ENCOUNTER — Ambulatory Visit: Payer: 59 | Admitting: Family Medicine

## 2021-07-22 ENCOUNTER — Emergency Department (HOSPITAL_BASED_OUTPATIENT_CLINIC_OR_DEPARTMENT_OTHER)
Admission: EM | Admit: 2021-07-22 | Discharge: 2021-07-22 | Disposition: A | Discharge status: Home / Self Care | Payer: 59 | Attending: Emergency Medicine | Admitting: Emergency Medicine

## 2021-07-22 ENCOUNTER — Encounter (HOSPITAL_BASED_OUTPATIENT_CLINIC_OR_DEPARTMENT_OTHER): Payer: Self-pay | Admitting: Emergency Medicine

## 2021-07-22 ENCOUNTER — Other Ambulatory Visit: Payer: Self-pay

## 2021-07-22 DIAGNOSIS — Z79899 Other long term (current) drug therapy: Secondary | ICD-10-CM | POA: Insufficient documentation

## 2021-07-22 DIAGNOSIS — R519 Headache, unspecified: Secondary | ICD-10-CM | POA: Diagnosis not present

## 2021-07-22 DIAGNOSIS — H6123 Impacted cerumen, bilateral: Secondary | ICD-10-CM | POA: Insufficient documentation

## 2021-07-22 DIAGNOSIS — R55 Syncope and collapse: Secondary | ICD-10-CM | POA: Insufficient documentation

## 2021-07-22 DIAGNOSIS — R42 Dizziness and giddiness: Secondary | ICD-10-CM | POA: Diagnosis not present

## 2021-07-22 LAB — BASIC METABOLIC PANEL
Anion gap: 9 (ref 5–15)
BUN: 10 mg/dL (ref 6–20)
CO2: 24 mmol/L (ref 22–32)
Calcium: 9.4 mg/dL (ref 8.9–10.3)
Chloride: 110 mmol/L (ref 98–111)
Creatinine, Ser: 0.76 mg/dL (ref 0.44–1.00)
GFR, Estimated: >60 mL/min (ref >60)
Glucose, Bld: 82 mg/dL (ref 70–99)
Potassium: 3.7 mmol/L (ref 3.5–5.1)
Sodium: 143 mmol/L (ref 135–145)

## 2021-07-22 LAB — URINALYSIS, ROUTINE W REFLEX MICROSCOPIC
Bilirubin Urine: NEGATIVE
Glucose, UA: NEGATIVE mg/dL
Hgb urine dipstick: NEGATIVE
Ketones, ur: NEGATIVE mg/dL
Nitrite: NEGATIVE
Protein, ur: NEGATIVE mg/dL
Specific Gravity, Urine: 1.015 (ref 1.005–1.030)
pH: 7 (ref 5.0–8.0)

## 2021-07-22 LAB — URINALYSIS, MICROSCOPIC (REFLEX)

## 2021-07-22 LAB — CBC
HCT: 43.4 % (ref 36.0–46.0)
Hemoglobin: 14.4 g/dL (ref 12.0–15.0)
MCH: 29.2 pg (ref 26.0–34.0)
MCHC: 33.2 g/dL (ref 30.0–36.0)
MCV: 88 fL (ref 80.0–100.0)
Platelets: 203 10*3/uL (ref 150–400)
RBC: 4.93 MIL/uL (ref 3.87–5.11)
RDW: 14.3 % (ref 11.5–15.5)
WBC: 4.6 10*3/uL (ref 4.0–10.5)
nRBC: 0 % (ref 0.0–0.2)

## 2021-07-22 LAB — CBG MONITORING, ED: Glucose-Capillary: 78 mg/dL (ref 70–99)

## 2021-07-22 NOTE — Discharge Instructions (Signed)
Please follow up with PCP. Drink plenty of water. Please return if your symptoms become more concerning. ?You can always try sudafed for your congestion to see if this helps any.  ?

## 2021-07-22 NOTE — ED Notes (Signed)
ED Provider at bedside. 

## 2021-07-22 NOTE — ED Notes (Signed)
IVF bolus initiated via infusion pump ?

## 2021-07-22 NOTE — ED Triage Notes (Signed)
Pt arrives pov, reports dizziness while at church. Was assisted to floor, denies loc. Pt denies HA, denies CP ?

## 2021-07-22 NOTE — ED Provider Notes (Signed)
[facility] EMERGENCY DEPARTMENT Provider Note   CSN: [id number] Arrival date & time: 07/22/21  1106     History Past medical history includes hypothyroidism, anemia, migraines  Chief Complaint  Patient presents with   Near Syncope    Frances Mckenzie is a 56 y.o. female.  Patient presents emergency department after an episode of having lightheadedness and feeling like she was in a pass out.  She states this occurred today while she was at church and sitting in her pew.  She looked down at her phone and then suddenly started having some dizzy symptoms.  She says she tried to stand up and she felt really weak to her feet and her legs started to give out.  She did this lasted about a couple of minutes until she started to feel better.  She has not had recurrence of the symptoms.  Of note, she says that over the past couple of days she has felt really congested with some sinus headache and feeling like her ears are clogged.  She has not had nasal congestion, cough, sore throat.  She denies any chest pain or shortness of breath.  She denies any abdominal pain, nausea, vomiting.  Denies visual disturbances, weakness, numbness.   Near Syncope      Home Medications Prior to Admission medications   Medication Sig Start Date End Date Taking? Authorizing Provider  Cholecalciferol (VITAMIN D PO) Take by mouth daily.    [provider]  DIGESTIVE ENZYMES PO Take by mouth daily.    [provider]  ferrous sulfate 325 (65 FE) MG tablet Take 325 mg by mouth daily with breakfast.    [provider]  levothyroxine (SYNTHROID, LEVOTHROID) 100 MCG tablet Take 100 mcg by mouth daily.    [provider]  Multiple Vitamin (MULTIVITAMIN) tablet Take 1 tablet by mouth daily.    [provider]  naproxen sodium (ANAPROX) 220 MG tablet Take 220 mg by mouth as needed.    [provider]  omeprazole (PRILOSEC) 40 MG capsule Take 40 mg by mouth  2 (two) times daily.    [provider]  VOLTAREN 1 % GEL  05/17/14   [provider]      Allergies    Patient has no known allergies.    Review of Systems   Review of Systems  Cardiovascular:  Positive for near-syncope.  Neurological:  Positive for dizziness, weakness and light-headedness.  All other systems reviewed and are negative.  Physical Exam Updated Vital Signs BP 130/82 (BP Location: Left Arm)    Pulse 76    Temp 98.7 F (37.1 C) (Oral)    Resp 16    Ht 5\' 6"  (1.676 m)    Wt 78 kg    LMP 07/20/2014    SpO2 100%    BMI 27.76 kg/m  Physical Exam Vitals and nursing note reviewed.  Constitutional:      General: She is not in acute distress.    Appearance: Normal appearance. She is not ill-appearing, toxic-appearing or diaphoretic.  HENT:     Head: Normocephalic and atraumatic.     Right Ear: Hearing, tympanic membrane, ear canal and external ear normal. There is impacted cerumen. Tympanic membrane is not perforated, erythematous or retracted.     Left Ear: Hearing, tympanic membrane, ear canal and external ear normal. There is impacted cerumen. Tympanic membrane is not perforated, erythematous or retracted.     Ears:     Comments: No evidence  of TM effusion.  There is some wax bilaterally That is obstructing my view to see the TM clearly, however what I can see looks normal.    Nose: No nasal deformity.     Mouth/Throat:     Lips: Pink. No lesions.     Mouth: Mucous membranes are moist. No injury, lacerations, oral lesions or angioedema.     Pharynx: Oropharynx is clear. Uvula midline. No pharyngeal swelling, oropharyngeal exudate, posterior oropharyngeal erythema or uvula swelling.  Eyes:     General: Gaze aligned appropriately. No scleral icterus.       Right eye: No discharge.        Left eye: No discharge.     Conjunctiva/sclera: Conjunctivae normal.     Right eye: Right conjunctiva is not injected. No exudate or hemorrhage.    Left eye: Left  conjunctiva is not injected. No exudate or hemorrhage.    Pupils: Pupils are equal, round, and reactive to light.  Cardiovascular:     Rate and Rhythm: Normal rate and regular rhythm.     Pulses: Normal pulses.          Radial pulses are 2+ on the right side and 2+ on the left side.       Dorsalis pedis pulses are 2+ on the right side and 2+ on the left side.     Heart sounds: Normal heart sounds, S1 normal and S2 normal. Heart sounds not distant. No murmur heard.   No friction rub. No gallop. No S3 or S4 sounds.  Pulmonary:     Effort: Pulmonary effort is normal. No accessory muscle usage or respiratory distress.     Breath sounds: Normal breath sounds. No stridor. No wheezing, rhonchi or rales.  Chest:     Chest wall: No tenderness.  Abdominal:     General: Abdomen is flat. Bowel sounds are normal. There is no distension.     Palpations: Abdomen is soft. There is no mass or pulsatile mass.     Tenderness: There is no abdominal tenderness. There is no guarding or rebound.  Musculoskeletal:     Right lower leg: No edema.     Left lower leg: No edema.  Skin:    General: Skin is warm and dry.     Coloration: Skin is not jaundiced or pale.     Findings: No bruising, erythema, lesion or rash.  Neurological:     General: No focal deficit present.     Mental Status: She is alert and oriented to person, place, and time.     GCS: GCS eye subscore is 4. GCS verbal subscore is 5. GCS motor subscore is 6.  Psychiatric:        Mood and Affect: Mood normal.        Behavior: Behavior normal. Behavior is cooperative.    ED Results / Procedures / Treatments   Labs (all labs ordered are listed, but only abnormal results are displayed) Labs Reviewed  URINALYSIS, ROUTINE W REFLEX MICROSCOPIC - Abnormal; Notable for the following components:      Result Value   Color, Urine STRAW (*)    Leukocytes,Ua MODERATE (*)    All other components within normal limits  URINALYSIS, MICROSCOPIC (REFLEX) -  Abnormal; Notable for the following components:   Bacteria, UA FEW (*)    All other components within normal limits  CBC  BASIC METABOLIC PANEL  CBG MONITORING, ED    EKG EKG Interpretation  Date/Time:  Sunday July 22 2021  11:36:44 EST Ventricular Rate:  76 PR Interval:  176 QRS Duration: 78 QT Interval:  382 QTC Calculation: 429 R Axis:   8 Text Interpretation: Normal sinus rhythm no acute ST/T changes similar to 2011 Confirmed by Pricilla Loveless [phone]) on 07/22/2021 12:31:45 PM  Radiology No results found.  Procedures Procedures  This patient was on telemetry or cardiac monitoring during their time in the ED.    Medications Ordered in ED Medications - No data to display  ED Course/ Medical Decision Making/ A&P                           Medical Decision Making Amount and/or Complexity of Data Reviewed Labs: ordered.    MDM  This is a 56 y.o. female who presents to the ED with a transient episode of lightheadedness and weakness while at church today.  My Impression, Plan, and ED Course: Patient appears well.  She has normal vitals.  She is no longer having her symptoms.  It seems that the episode was very transient and brief.  She did not actually pass out.  She has no other associated chest pain, shortness of breath, neurological symptoms, visual symptoms that are concerning.  She does note some sensations of her ears being congested, so this could be contributing to her symptoms.  We will obtain labs and EKG.  I personally ordered, reviewed, and interpreted all laboratory work and imaging and agree with radiologist interpretation. Results interpreted below: Labs unremarkable.  EKG with normal sinus rhythm.  Overall unremarkable work-up.  Patient symptoms improved after fluids.  She has ambulated normally without any recurrence of symptoms.  I recommended that she try nasal decongestants for sinus pressure.  Patient is back to her baseline.  She feels comfortable being  discharged home.  She plans to follow-up with her PCP.   Charting Requirements Additional history is obtained from:  Independent historian External Records from outside source obtained and reviewed including: n/a Social Determinants of Health:  none Pertinant PMH that complicates patient's illness: Hypothyroidism  Patient Care Problems that were addressed during this visit: - Lightheadedness: Acute illness with systemic symptoms This patient was maintained on a cardiac monitor/telemetry. I personally viewed and interpreted the cardiac monitor which reveals an underlying rhythm of NSR Medications given in ED: IVF Reevaluation of the patient after these medicines showed that the patient improved Disposition: f/u with pcp  Portions of this note were generated with Dragon dictation software. Dictation errors may occur despite best attempts at proofreading.    Final Clinical Impression(s) / ED Diagnoses Final diagnoses:  Lightheaded    Rx / DC Orders ED Discharge Orders     None         Claudie Leach, PA-C 07/22/21 Aretha Parrot, MD 07/23/21 [phone]

## 2021-08-27 ENCOUNTER — Other Ambulatory Visit: Payer: Self-pay | Admitting: Family Medicine

## 2021-08-27 DIAGNOSIS — Z1231 Encounter for screening mammogram for malignant neoplasm of breast: Secondary | ICD-10-CM

## 2021-09-11 ENCOUNTER — Ambulatory Visit
Admission: RE | Admit: 2021-09-11 | Discharge: 2021-09-11 | Disposition: A | Discharge status: Home / Self Care | Payer: 59 | Source: Ambulatory Visit | Attending: Family Medicine | Admitting: Family Medicine

## 2021-09-11 DIAGNOSIS — Z1231 Encounter for screening mammogram for malignant neoplasm of breast: Secondary | ICD-10-CM

## 2022-12-29 IMAGING — MG MM DIGITAL SCREENING BILAT W/ TOMO AND CAD
6 of 10 series · 6 of 30 positions shown · non-contrast
Comparison: Previous exam(s).

CLINICAL DATA: Screening.

EXAM:
DIGITAL SCREENING BILATERAL MAMMOGRAM WITH TOMOSYNTHESIS AND CAD
TECHNIQUE: Bilateral screening digital craniocaudal and mediolateral oblique
mammograms were obtained. Bilateral screening digital breast
tomosynthesis was performed. The images were evaluated with
computer-aided detection.

[R CC synth-2D]
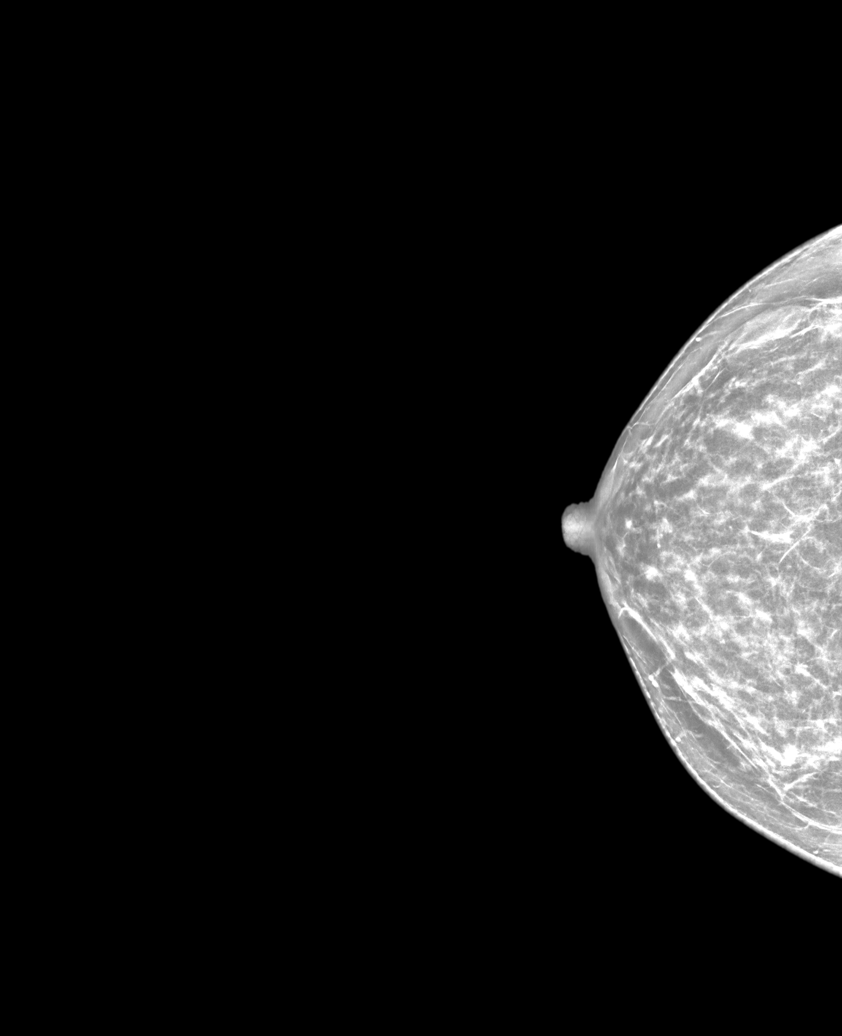

[R MLO synth-2D]
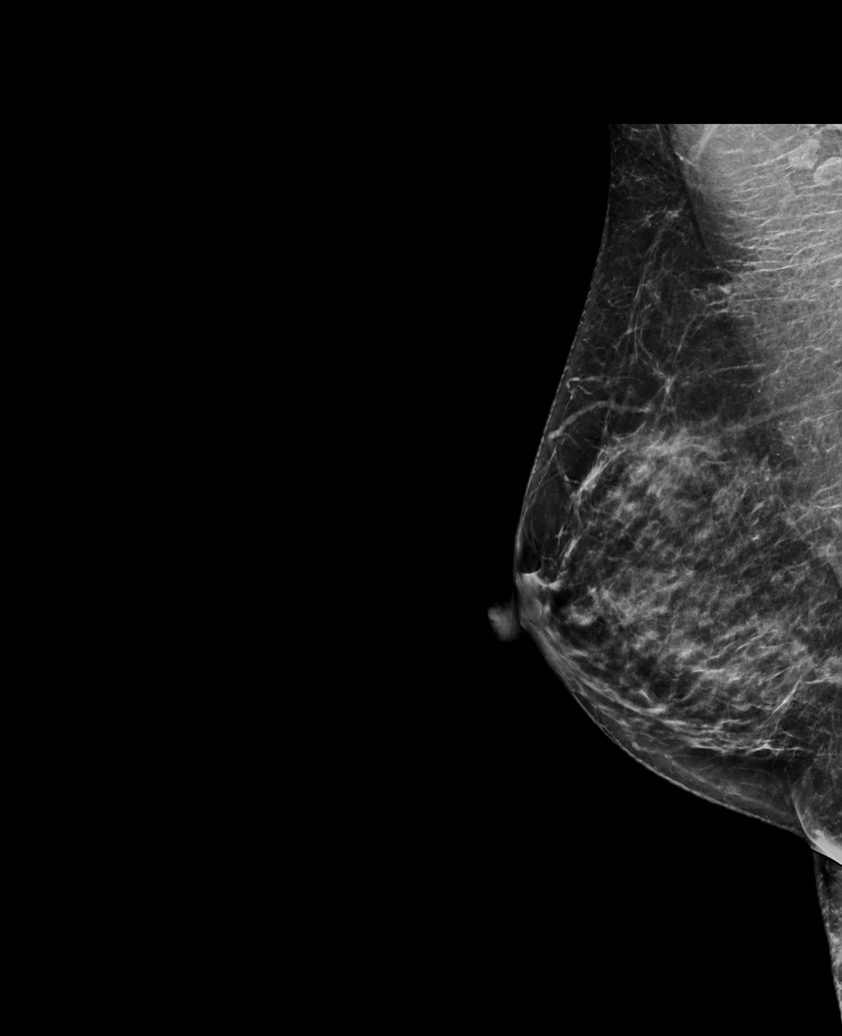

[L CC synth-2D]
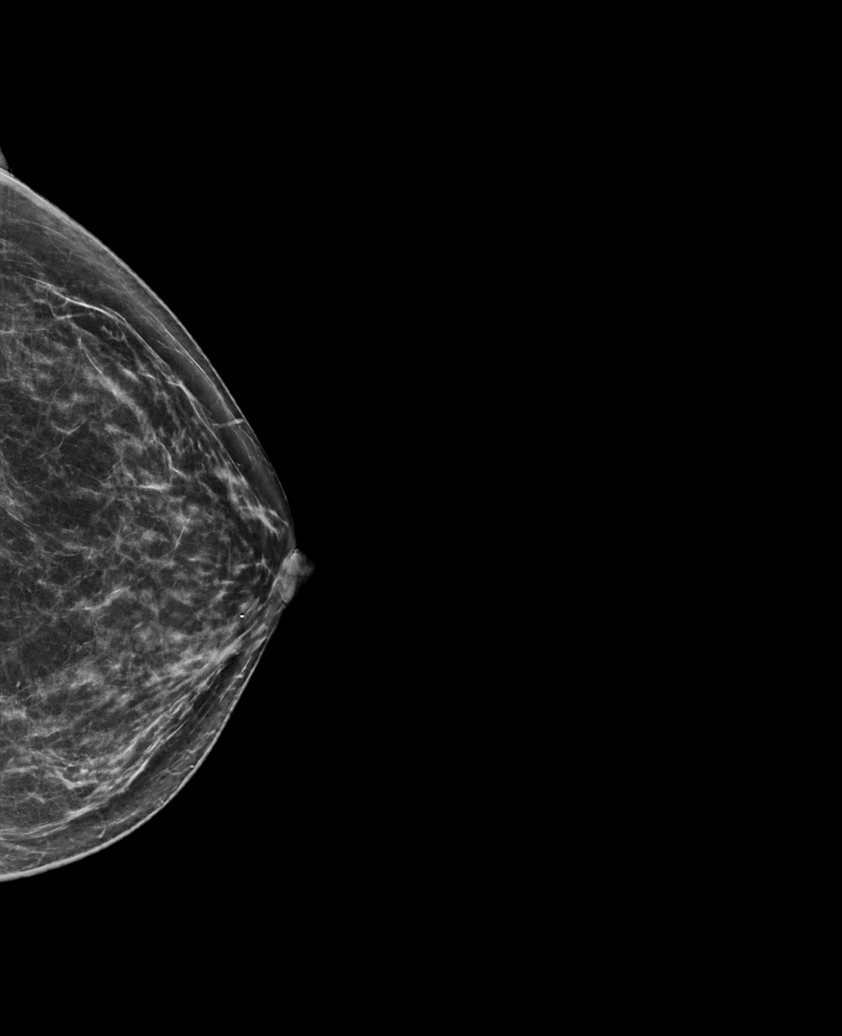

[R XCCL synth-2D]
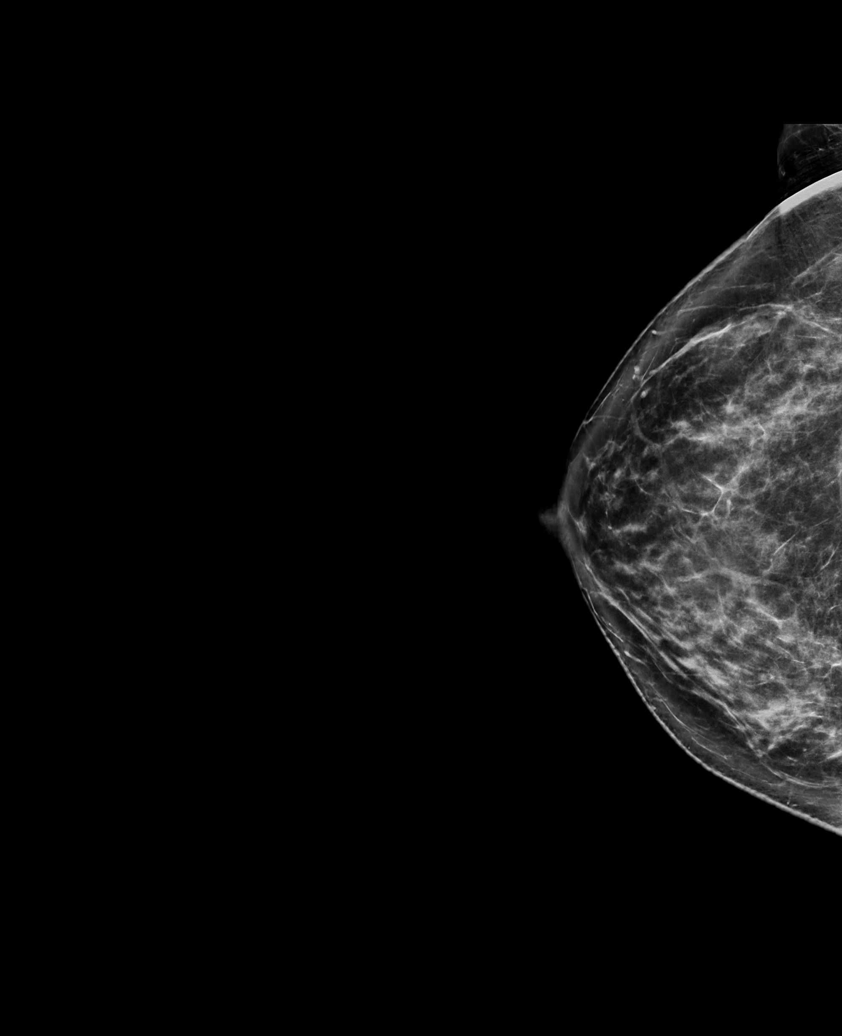

[L MLO synth-2D]
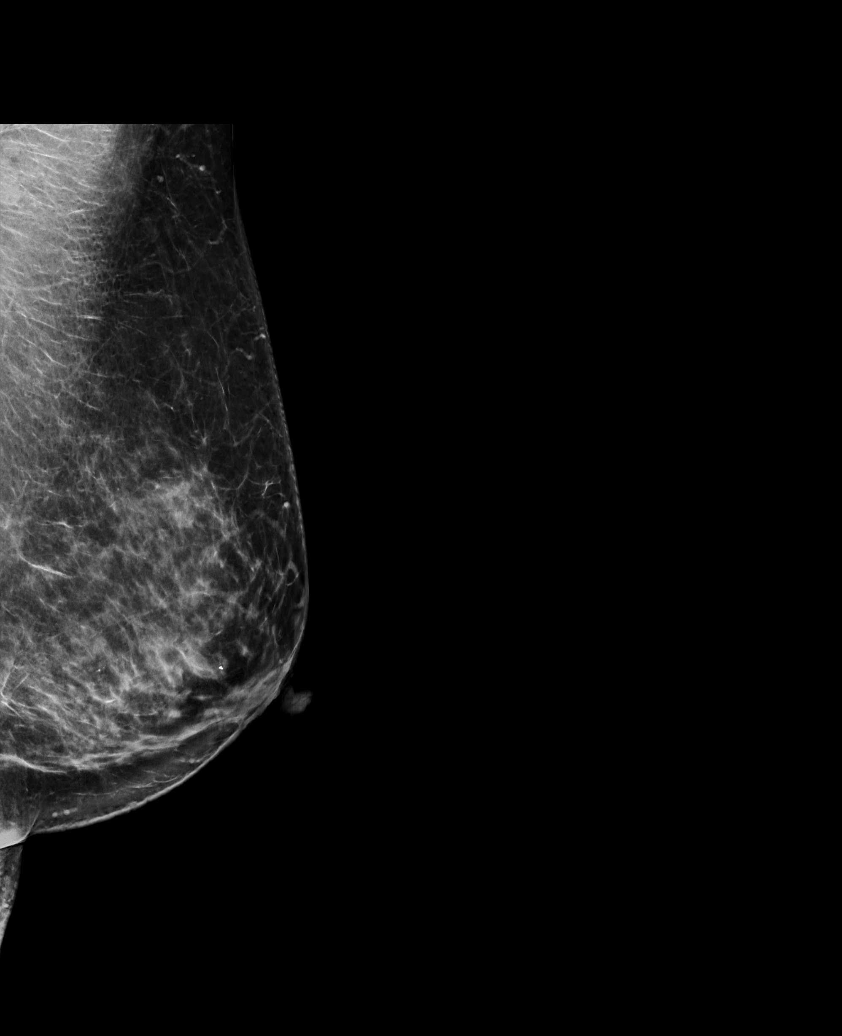

[L CC tomo · tomo slice 36/71.0]
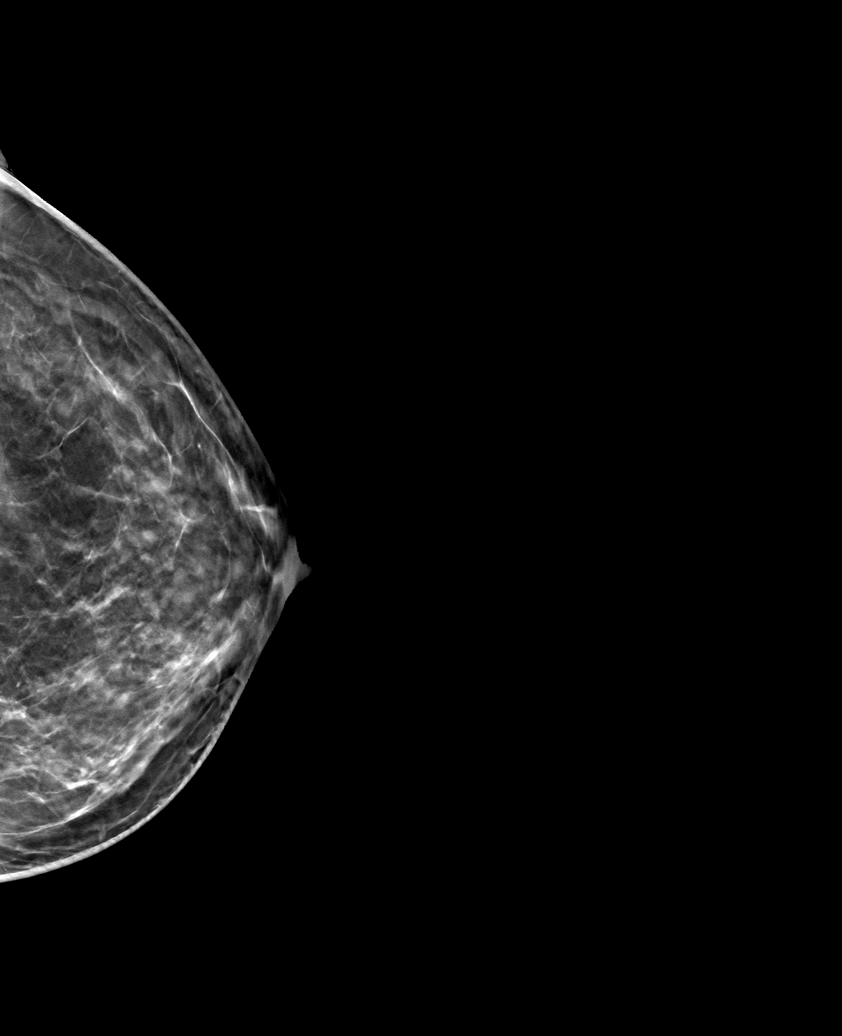

[6 of 30 positions shown; findings below may reference images not displayed]

ACR Breast Density Category c: The breast tissue is heterogeneously
dense, which may obscure small masses.
FINDINGS: There are no findings suspicious for malignancy.
IMPRESSION: No mammographic evidence of malignancy. A result letter of this
screening mammogram will be mailed directly to the patient.

RECOMMENDATION:
Screening mammogram in one year. (Code:Q3-W-BC3)

BI-RADS CATEGORY  1: Negative.

## 2023-01-14 ENCOUNTER — Other Ambulatory Visit: Payer: Self-pay | Admitting: Family Medicine

## 2023-01-14 DIAGNOSIS — Z1231 Encounter for screening mammogram for malignant neoplasm of breast: Secondary | ICD-10-CM

## 2023-01-15 ENCOUNTER — Ambulatory Visit
Admission: RE | Admit: 2023-01-15 | Discharge: 2023-01-15 | Disposition: A | Discharge status: Home / Self Care | Payer: 59 | Source: Ambulatory Visit | Attending: Family Medicine | Admitting: Family Medicine

## 2023-01-15 DIAGNOSIS — Z1231 Encounter for screening mammogram for malignant neoplasm of breast: Secondary | ICD-10-CM

## 2024-02-16 ENCOUNTER — Other Ambulatory Visit: Payer: Self-pay | Admitting: Family Medicine

## 2024-02-16 DIAGNOSIS — Z1231 Encounter for screening mammogram for malignant neoplasm of breast: Secondary | ICD-10-CM

## 2024-02-25 ENCOUNTER — Ambulatory Visit
Admission: RE | Admit: 2024-02-25 | Discharge: 2024-02-25 | Disposition: A | Discharge status: Home / Self Care | Source: Ambulatory Visit | Attending: Family Medicine | Admitting: Family Medicine

## 2024-02-25 DIAGNOSIS — Z1231 Encounter for screening mammogram for malignant neoplasm of breast: Secondary | ICD-10-CM

## 2024-03-28 ENCOUNTER — Ambulatory Visit

## 2024-03-28 ENCOUNTER — Ambulatory Visit
Admission: EM | Admit: 2024-03-28 | Discharge: 2024-03-28 | Disposition: A | Discharge status: Home / Self Care | Attending: Family Medicine | Admitting: Family Medicine

## 2024-03-28 ENCOUNTER — Ambulatory Visit: Payer: Self-pay | Admitting: Urgent Care

## 2024-03-28 DIAGNOSIS — M79671 Pain in right foot: Secondary | ICD-10-CM

## 2024-03-28 MED ORDER — DICLOFENAC SODIUM 3 % EX GEL: 2.0000 g | Freq: Two times a day (BID) | CUTANEOUS | 0 refills | Status: AC | PRN

## 2024-03-28 NOTE — ED Provider Notes (Signed)
 [facility]  Note:  This document was prepared using Conservation officer, historic buildings and may include unintentional dictation errors.  MRN: [id number] DOB: [birth year]  Subjective:   Frances [place] is a 58 y.o. female presenting for 2-day history of recurrent pain along the medial aspect of the right foot.  She had the exact same problem 1 month ago and resolved on its own without any interventions.  No fall, trauma, bruising, swelling, history of musculoskeletal conditions, gout.  No current facility-administered medications for this encounter.  Current Outpatient Medications:    Cholecalciferol (VITAMIN D PO), Take by mouth daily., Disp: , Rfl:    DIGESTIVE ENZYMES PO, Take by mouth daily., Disp: , Rfl:    ferrous sulfate 325 (65 FE) MG tablet, Take 325 mg by mouth daily with breakfast., Disp: , Rfl:    levothyroxine (SYNTHROID, LEVOTHROID) 100 MCG tablet, Take 100 mcg by mouth daily., Disp: , Rfl:    Multiple Vitamin (MULTIVITAMIN) tablet, Take 1 tablet by mouth daily., Disp: , Rfl:    naproxen sodium (ANAPROX) 220 MG tablet, Take 220 mg by mouth as needed., Disp: , Rfl:    omeprazole (PRILOSEC) 40 MG capsule, Take 40 mg by mouth 2 (two) times daily., Disp: , Rfl:    VOLTAREN 1 % GEL, , Disp: , Rfl: 0   No Known Allergies  Past Medical History:  Diagnosis Date   Anemia    Hypothyroidism    Migraine headache    PAC (premature atrial contraction)    PVC (premature ventricular contraction)      Past Surgical History:  Procedure Laterality Date   TUBAL LIGATION  1995    Family History  Problem Relation Age of Onset   Hypertension Mother    Rheum arthritis Mother    Heart disease Brother    Hypertension Other    Heart attack Other    Diabetes Other    Colon cancer Neg Hx    Breast cancer Neg Hx     Social History   Tobacco Use   Smoking status: Never   Smokeless tobacco: Never  Vaping Use   Vaping status: Never Used  Substance Use  Topics   Alcohol use: Yes    Comment: occ   Drug use: No    ROS   Objective:   Vitals: BP (!) 145/88 (BP Location: Right Arm)   Pulse 62   Temp 98.6 F (37 C) (Oral)   Resp 16   LMP 07/20/2014   SpO2 97%   Physical Exam Constitutional:      General: She is not in acute distress.    Appearance: Normal appearance. She is well-developed. She is not ill-appearing, toxic-appearing or diaphoretic.  HENT:     Head: Normocephalic and atraumatic.     Nose: Nose normal.     Mouth/Throat:     Mouth: Mucous membranes are moist.  Eyes:     General: No scleral icterus.       Right eye: No discharge.        Left eye: No discharge.     Extraocular Movements: Extraocular movements intact.  Cardiovascular:     Rate and Rhythm: Normal rate.  Pulmonary:     Effort: Pulmonary effort is normal.  Musculoskeletal:       Feet:  Skin:    General: Skin is warm and dry.  Neurological:     General: No focal deficit present.     Mental Status: She is alert and  oriented to person, place, and time.  Psychiatric:        Mood and Affect: Mood normal.        Behavior: Behavior normal.     Assessment and Plan :   PDMP not reviewed this encounter.  1. Arch pain of right foot    Recommended conservative management using topical diclofenac gel 3%.  If this turns out to not be covered by her insurance, can switch to diclofenac gel 1%.  Follow-up with podiatry.  X-ray over-read was pending at time of discharge, recommended follow up with only abnormal results. Otherwise will not call for negative over-read. Patient was in agreement. Counseled patient on potential for adverse effects with medications prescribed/recommended today, ER and return-to-clinic precautions discussed, patient verbalized understanding.    Christopher Savannah, PA-C 03/28/24 1453

## 2024-03-28 NOTE — ED Triage Notes (Addendum)
 Pt c/o right medial foot pain started yesterday-denies injury-states she had same pain ~1 month ago that lasted x 2 days and resolved-no pain meds PTA-NAD-limping gait

## 2024-03-28 NOTE — Discharge Instructions (Addendum)
 Start diclofenac gel twice daily as needed for local pain relief. Follow up with a podiatrist at your convenience. Will update your x-ray findings once I receive a report.   [facility] ([place]) Podiatrist in [place], [place]  COVID-19 info: [email or web address] Get online care: [email or web address] Address: [address] [place], [place], [place] [address] Phone: [phone] Appointments: [email or web address]   [facility], [place], [place] Doctor in [place], [place]  Address: [address] [place], [place] [address] Phone: [phone]
